# Patient Record
Sex: Female | Born: 2000 | Race: Black or African American | Hispanic: No | Marital: Single | Smoking: Never smoker
Health system: Southern US, Community
[De-identification: ages and names within clinical notes are randomized; demographics above are authoritative.]

---
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
## 2021-06-13 ENCOUNTER — Encounter (HOSPITAL_COMMUNITY): Payer: Self-pay

## 2021-06-13 ENCOUNTER — Emergency Department (HOSPITAL_COMMUNITY)
Admission: EM | Admit: 2021-06-13 | Discharge: 2021-06-14 | Disposition: A | Discharge status: Home / Self Care | Payer: Medicaid Other | Attending: Emergency Medicine | Admitting: Emergency Medicine

## 2021-06-13 ENCOUNTER — Other Ambulatory Visit: Payer: Self-pay

## 2021-06-13 DIAGNOSIS — S161XXA Strain of muscle, fascia and tendon at neck level, initial encounter: Secondary | ICD-10-CM

## 2021-06-13 DIAGNOSIS — S199XXA Unspecified injury of neck, initial encounter: Secondary | ICD-10-CM | POA: Diagnosis present

## 2021-06-13 DIAGNOSIS — Y93I9 Activity, other involving external motion: Secondary | ICD-10-CM | POA: Insufficient documentation

## 2021-06-13 DIAGNOSIS — S139XXA Sprain of joints and ligaments of unspecified parts of neck, initial encounter: Secondary | ICD-10-CM | POA: Insufficient documentation
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
## 2021-06-14 ENCOUNTER — Emergency Department (HOSPITAL_COMMUNITY): Payer: Medicaid Other

## 2021-06-14 MED ORDER — HYDROCODONE-ACETAMINOPHEN 5-325 MG PO TABS
1.0000 | ORAL_TABLET | Freq: Once | ORAL | Status: AC
  Administered 2021-06-14: 1 via ORAL
  Filled 2021-06-14: qty 1
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
## 2021-06-14 NOTE — Discharge Instructions (Signed)
You have neck pain, possibly from a cervical strain and/or pinched nerve.  ° °SEEK IMMEDIATE MEDICAL ATTENTION IF: °You develop difficulties swallowing or breathing.  °You have new or worse numbness, weakness, tingling, or movement problems in your arms or legs.  °You develop increasing pain which is uncontrolled with medications.  °You have change in bowel or bladder function, or other concerns. ° ° ° °
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
## 2023-10-11 ENCOUNTER — Other Ambulatory Visit (INDEPENDENT_AMBULATORY_CARE_PROVIDER_SITE_OTHER): Payer: Medicaid Other

## 2023-10-11 ENCOUNTER — Ambulatory Visit (INDEPENDENT_AMBULATORY_CARE_PROVIDER_SITE_OTHER): Payer: Medicaid Other | Admitting: Physician Assistant

## 2023-10-11 DIAGNOSIS — M25532 Pain in left wrist: Secondary | ICD-10-CM

## 2023-10-11 DIAGNOSIS — M654 Radial styloid tenosynovitis [de Quervain]: Secondary | ICD-10-CM | POA: Diagnosis not present

## 2023-10-11 MED ORDER — METHYLPREDNISOLONE 4 MG PO TBPK: ORAL_TABLET | ORAL | 0 refills | Status: AC

## 2023-10-11 NOTE — Progress Notes (Signed)
   Office Visit Note   Patient: Diana Ross           Date of Birth: [birth year]           MRN: [id number] Visit Date: 10/11/2023              Requested by: No referring provider defined for this encounter. PCP: Maryellen Pile, MD (Inactive)   Assessment & Plan: Visit Diagnoses:  1. De Quervain's tenosynovitis, left     Plan: Impression is left wrist de Quervain's tenosynovitis.  Today, we discussed various treatment options to include Velcro thumb spica splint in addition to oral and topical NSAIDs versus cortisone injection.  She would like to hold off on injection for now and try medication first.  If her symptoms do not improve she will follow-up for injection.  Call with concerns or questions.  Follow-Up Instructions: Return if symptoms worsen or fail to improve.   Orders:  Orders Placed This Encounter  Procedures   XR Wrist Complete Left   No orders of the defined types were placed in this encounter.     Procedures: No procedures performed   Clinical Data: No additional findings.   Subjective: Chief Complaint  Patient presents with   Left Wrist - Pain    HPI patient is a pleasant 23 year old right-hand-dominant female who comes in today with left wrist pain for the past week.  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  All of her pain is to the first dorsal compartment.  Pain is worse with wrist flexion and extension.  She has been taking Tylenol without relief.  Review of Systems as detailed in HPI.  All others reviewed and are negative.   Objective: Vital Signs: There were no vitals taken for this visit.  Physical Exam well-developed well-nourished female no acute distress.  Alert and oriented x 3.  Ortho Exam left wrist exam: Tenderness along the first dorsal compartment.  Markedly positive Lourena Simmonds.  She does have increased pain with wrist flexion  and extension.  No pain into the volar or dorsal forearm.  No ulnar-sided wrist pain.  She is neurovascularly intact distally.  Specialty Comments:  No specialty comments available.  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  Family History  Problem Relation Age of Onset   Food Allergy Mother        chocolate   Allergic rhinitis Neg Hx    Angioedema Neg Hx    Asthma Neg Hx    Atopy Neg Hx    Eczema Neg Hx    Immunodeficiency Neg Hx    Urticaria Neg Hx     Past Surgical History:  Procedure Laterality Date   WISDOM TOOTH EXTRACTION  01/2016   Social History   Occupational History   Not on file  Tobacco Use   Smoking status: Never   Smokeless tobacco: Never  Vaping Use   Vaping status: Never Used  Substance and Sexual Activity   Alcohol use: No   Drug use: No   Sexual activity: Not on file

## 2024-08-27 ENCOUNTER — Encounter: Payer: Self-pay | Admitting: Physician Assistant

## 2024-09-18 ENCOUNTER — Ambulatory Visit: Admitting: Physician Assistant

## 2024-09-27 ENCOUNTER — Encounter: Payer: Self-pay | Admitting: Physician Assistant

## 2024-10-30 ENCOUNTER — Ambulatory Visit: Admitting: Physician Assistant
